# Patient Record
Sex: Male | Born: 1995 | Race: White | Hispanic: No | Marital: Single | Smoking: Never smoker
Health system: Southern US, Community
[De-identification: ages and names within clinical notes are randomized; demographics above are authoritative.]

## PROBLEM LIST (undated history)

## (undated) DIAGNOSIS — T7840XA Allergy, unspecified, initial encounter: Secondary | ICD-10-CM

## (undated) HISTORY — PX: WRIST FRACTURE SURGERY: SHX121

## (undated) HISTORY — DX: Allergy, unspecified, initial encounter: T78.40XA

## (undated) HISTORY — PX: OTHER SURGICAL HISTORY: SHX169

---
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
## 2017-05-02 ENCOUNTER — Ambulatory Visit: Payer: Managed Care, Other (non HMO) | Admitting: Family Medicine

## 2017-05-02 ENCOUNTER — Encounter: Payer: Self-pay | Admitting: Family Medicine

## 2017-05-02 ENCOUNTER — Ambulatory Visit (INDEPENDENT_AMBULATORY_CARE_PROVIDER_SITE_OTHER): Payer: Managed Care, Other (non HMO)

## 2017-05-02 ENCOUNTER — Ambulatory Visit: Payer: Self-pay

## 2017-05-02 VITALS — BP 113/68 | HR 76 | Temp 97.6°F | Ht 69.5 in | Wt 171.4 lb

## 2017-05-02 DIAGNOSIS — M79671 Pain in right foot: Secondary | ICD-10-CM

## 2017-05-02 DIAGNOSIS — S93491A Sprain of other ligament of right ankle, initial encounter: Secondary | ICD-10-CM | POA: Diagnosis not present

## 2017-05-02 MED ORDER — IBUPROFEN 600 MG PO TABS: 600.0000 mg | ORAL_TABLET | Freq: Three times a day (TID) | ORAL | 0 refills | Status: DC | PRN

## 2017-05-02 NOTE — Progress Notes (Signed)
Subjective: CC: Foot injury PCP: Bennie PieriniMartin, Mary-Margaret, FNP ZOX:WRUEAHPI:Rhyatt Redge GainerM Branford is a 22 y.o. male presenting to clinic today for:  1. Foot injury Patient reports that he was performing in a competition where he was doing chin ups and he vaulted off of the bar from several feet off the ground.  He notes that he landed in a twisting motion.  He shows me a video of this.  It does not appear that he landed on the calcaneus but rather than the midfoot with a slight twisting motion of the foot.  No eversion or inversion noted.  He notes that he had pain, particularly along the medial aspect of the heel.  This had associated swelling and bruising at that time.  Denies any numbness or tingling.  He reports discomfort with ambulation.  He has been using a compression sleeve but no other medications for symptoms.  He comes in to make sure he has not broken anything.   ROS: Per HPI  No Known Allergies Past Medical History:  Diagnosis Date  . Allergy    seasonal   No current outpatient medications on file. Social History   Socioeconomic History  . Marital status: Single    Spouse name: Not on file  . Number of children: Not on file  . Years of education: Not on file  . Highest education level: Not on file  Social Needs  . Financial resource strain: Not on file  . Food insecurity - worry: Not on file  . Food insecurity - inability: Not on file  . Transportation needs - medical: Not on file  . Transportation needs - non-medical: Not on file  Occupational History  . Not on file  Tobacco Use  . Smoking status: Never Smoker  . Smokeless tobacco: Never Used  Substance and Sexual Activity  . Alcohol use: No  . Drug use: No  . Sexual activity: No  Other Topics Concern  . Not on file  Social History Narrative   Student [facility] [employer].  Studying art.  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   Family History  Problem Relation Age of Onset  . Heart disease Maternal Grandmother   . Heart attack  Maternal Grandfather 70  . Hypertension Paternal Grandfather     Objective: Office vital signs reviewed. BP 113/68   Pulse 76   Temp 97.6 F (36.4 C) (Oral)   Ht 5' 9.5" (1.765 m)   Wt 171 lb 6 oz (77.7 kg)   BMI 24.94 kg/m   Physical Examination:  General: Awake, alert, well nourished, well appearing, No acute distress Extremities: warm, well perfused, No edema, cyanosis or clubbing; +2 pulses bilaterally MSK: normal gait and normal station  Right foot: Patient has full active range of motion.  5/5 strength of the ankle and foot in all planes.  There is a yellow bruise appreciated along the medial calcaneus and behind the medial malleolus.  Patient has no gross visible deformities otherwise.  No tenderness to palpation along the medial or lateral malleoli.  He has mild tenderness along the medial aspect of the calcaneus and over the area of the tibial calcaneal ligament.  No gross deformities or gapping noted with ligamentous testing.  No ligamentous laxity noted. Skin: dry; intact; no rashes or lesions Neuro: light touch sensation grossly in tact  Dg Foot Complete Right  Result Date: 05/02/2017 CLINICAL DATA:  22 year old male status post 6 ft jump landing on bare feet several days ago. Painful arch with bruising.  EXAM: RIGHT FOOT COMPLETE - 3+ VIEW COMPARISON:  None. FINDINGS: Bone mineralization is within normal limits. Skeletally mature. Normal joint spaces and alignment throughout the right foot. The calcaneus appears intact. Other tarsal bones appear intact. Metatarsals appear intact and normally aligned. No fracture or dislocation identified. IMPRESSION: No acute fracture or dislocation identified about the right foot. Electronically Signed   By: Odessa Fleming M.D.   On: 05/02/2017 12:26     Assessment/ Plan: 22 y.o. male   1. Right foot pain Given reports of instant swelling, bruising, x-ray was obtained to further evaluate.  Personally reviewed demonstrated no acute fractures or  dislocations.  Radiologist review confirms negative x-ray.  No ligamentous laxity on exam but I do suspect that he is got a significant sprain.  I did offer him a lace up ankle brace but patient preferred to continue with his own ankle sleeve.  Rice therapy recommended.  Ibuprofen 600 mg p.o. 3 times daily as needed pain or swelling.  Avoid high-impact activity and aggravating activities for the next several weeks.  Home care instructions reviewed.  Patient follow-up as needed. - DG Foot Complete Right; Future  2. Sprain of other ligament of right ankle, initial encounter As above.   Orders Placed This Encounter  Procedures  . DG Foot Complete Right    Standing Status:   Future    Standing Expiration Date:   07/03/2018    Order Specific Question:   Reason for Exam (SYMPTOM  OR DIAGNOSIS REQUIRED)    Answer:   foot pain    Order Specific Question:   Preferred imaging location?    Answer:   Internal    Order Specific Question:   Radiology Contrast Protocol - do NOT remove file path    Answer:   [email or web address]   Meds ordered this encounter  Medications  . ibuprofen (ADVIL,MOTRIN) 600 MG tablet    Sig: Take 1 tablet (600 mg total) by mouth every 8 (eight) hours as needed.    Dispense:  30 tablet    Refill:  0     Ashly Hulen Skains, DO [facility] [phone]

## 2017-05-02 NOTE — Patient Instructions (Signed)
You have been prescribed an NSAID today to help with pain and inflammation.  Please make sure that you take this medication with a meal and plenty of water.  Please do not take any other NSAID medications while on this medication.  NSAID medications include: ibuprofen (Advil, Motrin), naproxen (Aleve, Naprosyn), Celebrex, diclofenac (Voltaren), meloxicam (Mobic), etc.  If you are on a daily aspirin, please take this medication with extreme caution as the combination can increase your risk of bleeding and GI upset.  If you have a history of GI bleed or stomach ulcer, you should not take NSAID medications.  If you have a history of acid reflux, you should make sure to take antacid medication daily while on NSAID.   Ankle Sprain An ankle sprain is a stretch or tear in one of the tough tissues (ligaments) in your ankle. Follow these instructions at home:  Rest your ankle.  Take over-the-counter and prescription medicines only as told by your doctor.  For 2-3 days, keep your ankle higher than the level of your heart (elevated) as much as possible.  If directed, put ice on the area: ? Put ice in a plastic bag. ? Place a towel between your skin and the bag. ? Leave the ice on for 20 minutes, 2-3 times a day.  If you were given a brace: ? Wear it as told. ? Take it off to shower or bathe. ? Try not to move your ankle much, but wiggle your toes from time to time. This helps to prevent swelling.  If you were given an elastic bandage (dressing): ? Take it off when you shower or bathe. ? Try not to move your ankle much, but wiggle your toes from time to time. This helps to prevent swelling. ? Adjust the bandage to make it more comfortable if it feels too tight. ? Loosen the bandage if you lose feeling in your foot, your foot tingles, or your foot gets cold and blue.  If you have crutches, use them as told by your doctor. Continue to use them until you can walk without feeling pain in your  ankle. Contact a doctor if:  Your bruises or swelling are quickly getting worse.  Your pain does not get better after you take medicine. Get help right away if:  You cannot feel your toes or foot.  Your toes or your foot looks blue.  You have very bad pain that gets worse. This information is not intended to replace advice given to you by your health care provider. Make sure you discuss any questions you have with your health care provider. Document Released: 07/26/2007 Document Revised: 07/15/2015 Document Reviewed: 09/08/2014 Elsevier Interactive Patient Education  [employer]2018 [employer]

## 2017-08-29 ENCOUNTER — Ambulatory Visit: Payer: Managed Care, Other (non HMO) | Admitting: Pediatrics

## 2017-08-29 ENCOUNTER — Encounter: Payer: Self-pay | Admitting: Pediatrics

## 2017-08-29 VITALS — BP 121/69 | HR 64 | Temp 97.4°F | Ht 69.0 in | Wt 185.0 lb

## 2017-08-29 DIAGNOSIS — H6121 Impacted cerumen, right ear: Secondary | ICD-10-CM

## 2017-08-29 NOTE — Progress Notes (Signed)
  Subjective:   Patient ID: Jerry Cooley, male    DOB: 08-03-1995, 22 y.o.   MRN: [id number] CC: Right ear stopped up (since vacation)  HPI: Jerry Cooley is a 22 y.o. male   For the last week he has felt like his right ear is clogged.  No URI symptoms, no ear pain, no fevers.  Appetite is normal.  Feels like he cannot hear quite as well from his right ear compared to his left ear.  Relevant past medical, surgical, family and social history reviewed. Allergies and medications reviewed and updated. Social History   Tobacco Use  Smoking Status Never Smoker  Smokeless Tobacco Never Used   ROS: Per HPI   Objective:    BP 121/69   Pulse 64   Temp (!) 97.4 F (36.3 C) (Oral)   Ht 5\' 9"  (1.753 m)   Wt 185 lb (83.9 kg)   BMI 27.32 kg/m   Wt Readings from Last 3 Encounters:  08/29/17 185 lb (83.9 kg) (84 %, Z= 1.01)*  05/02/17 171 lb 6 oz (77.7 kg) (74 %, Z= 0.63)*  12/29/16 165 lb (74.8 kg) (68 %, Z= 0.46)*   * Growth percentiles are based on CDC (Boys, 2-20 Years) data.    Gen: NAD, alert, cooperative with exam, NCAT EYES: EOMI, no conjunctival injection, or no icterus ENT: Left TM normal pearly gray.  Right TM with impacted cerumen.  OP without erythema LYMPH: no cervical LAD CV: NRRR, normal S1/S2, no murmur, distal pulses 2+ b/l Resp: CTABL, no wheezes, normal WOB Ext: No edema, warm Neuro: Alert and oriented  Assessment & Plan:  Jerry Cooley was seen today for right ear stopped up.  Diagnoses and all orders for this visit:  Impacted cerumen of right ear Discussed options.  Impacted cerumen removal: After procedure described to patient and  patient agreed with proceeding, cerumen impaction removal was attempted from right ears using currette.  Right still with some cerumen after procedure with the decision made with patient to stop as he had had some improvement in symptoms and a fair amount of cerumen removed.  Pt tolerated procedure well.  He will use Debrox drops and  return if needed.   Follow up plan: As needed Rex Krasarol Marcello Tuzzolino, MD [facility]

## 2017-11-19 ENCOUNTER — Ambulatory Visit: Payer: Managed Care, Other (non HMO) | Admitting: Nurse Practitioner

## 2017-11-21 ENCOUNTER — Encounter: Payer: Self-pay | Admitting: Nurse Practitioner

## 2018-03-08 ENCOUNTER — Ambulatory Visit: Payer: Managed Care, Other (non HMO) | Admitting: Nurse Practitioner

## 2018-03-14 ENCOUNTER — Encounter: Payer: Self-pay | Admitting: Nurse Practitioner

## 2018-03-18 ENCOUNTER — Ambulatory Visit: Payer: Managed Care, Other (non HMO) | Admitting: Nurse Practitioner

## 2018-03-19 ENCOUNTER — Ambulatory Visit: Payer: Managed Care, Other (non HMO) | Admitting: Nurse Practitioner

## 2018-03-20 ENCOUNTER — Encounter: Payer: Self-pay | Admitting: Nurse Practitioner

## 2018-08-31 IMAGING — DX DG WRIST COMPLETE 3+V*R*
4 series · 4 of 4 positions shown · non-contrast
Comparison: None.

CLINICAL DATA: Right wrist pain.

EXAM:
RIGHT WRIST - COMPLETE 3+ VIEW

[wrist ap (1 of 2)]
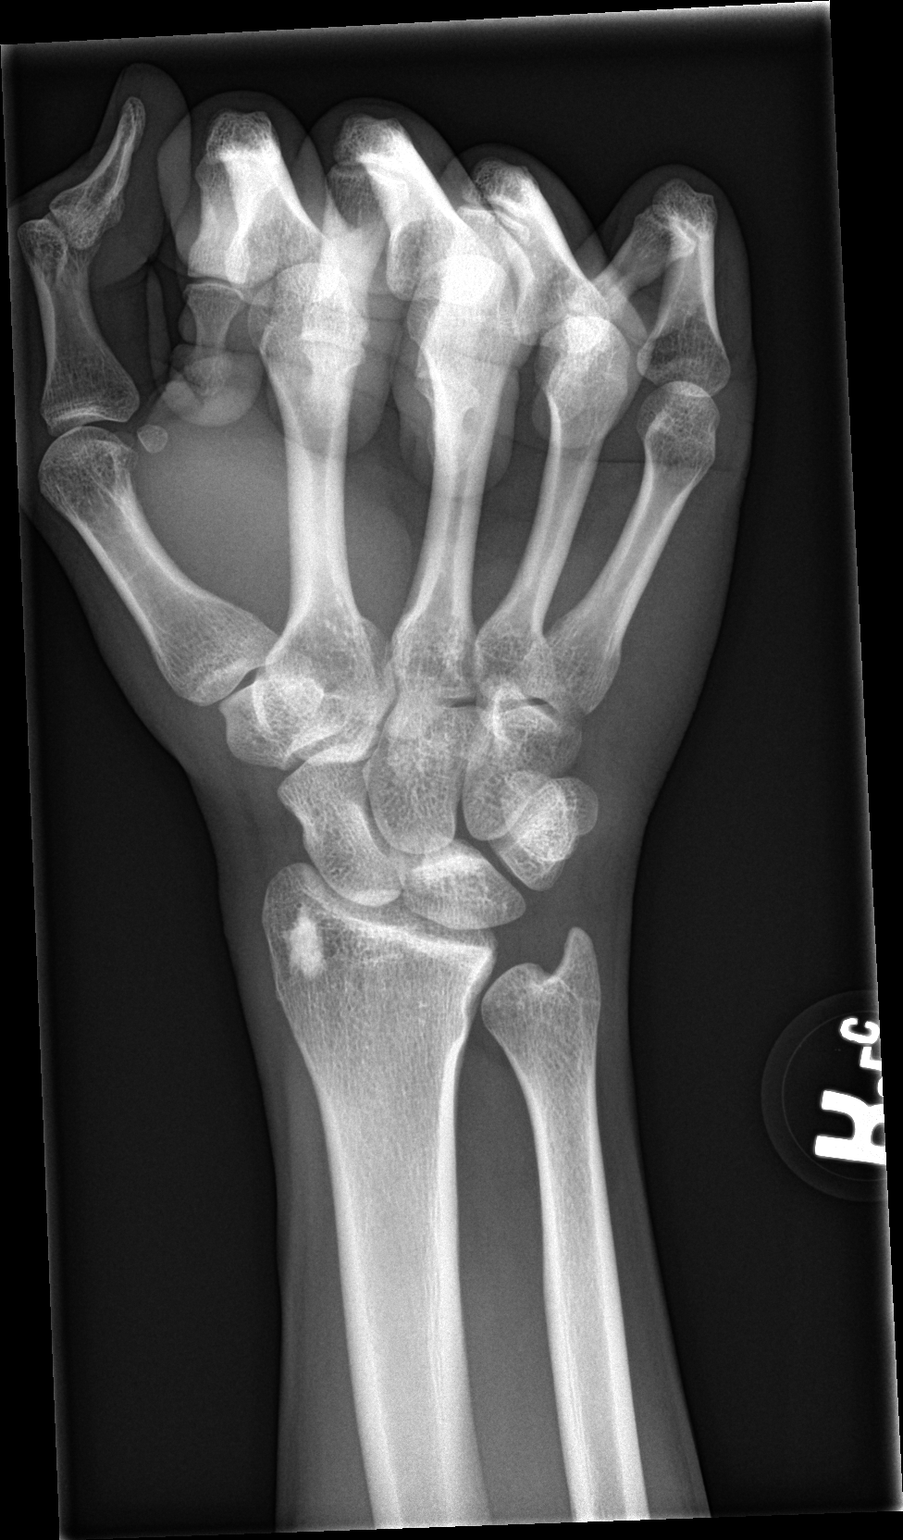

[wrist obl]
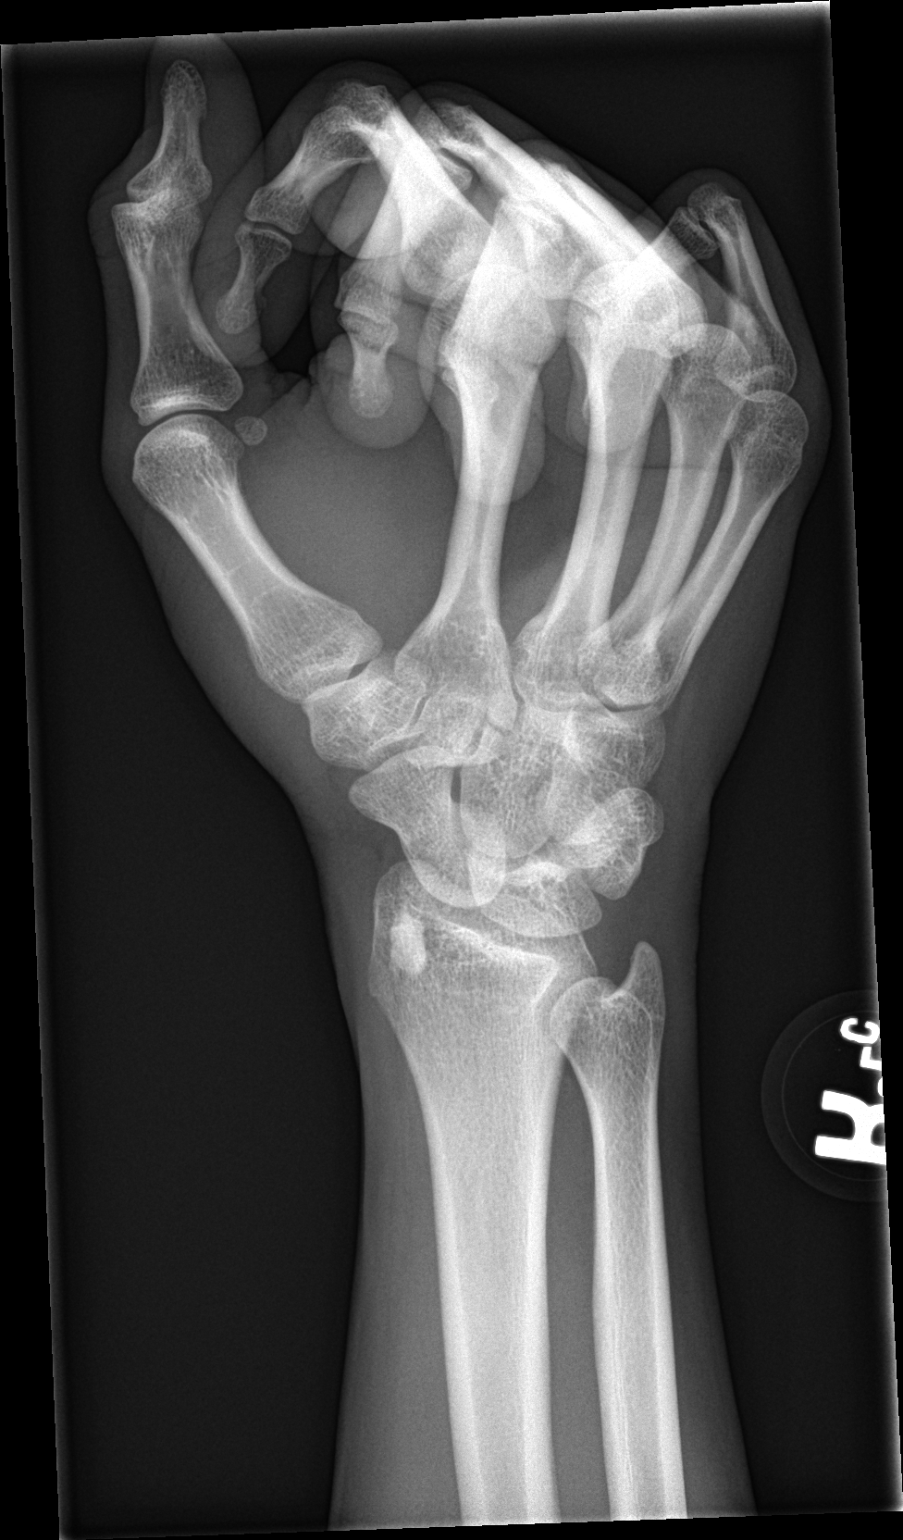

[wrist lat]
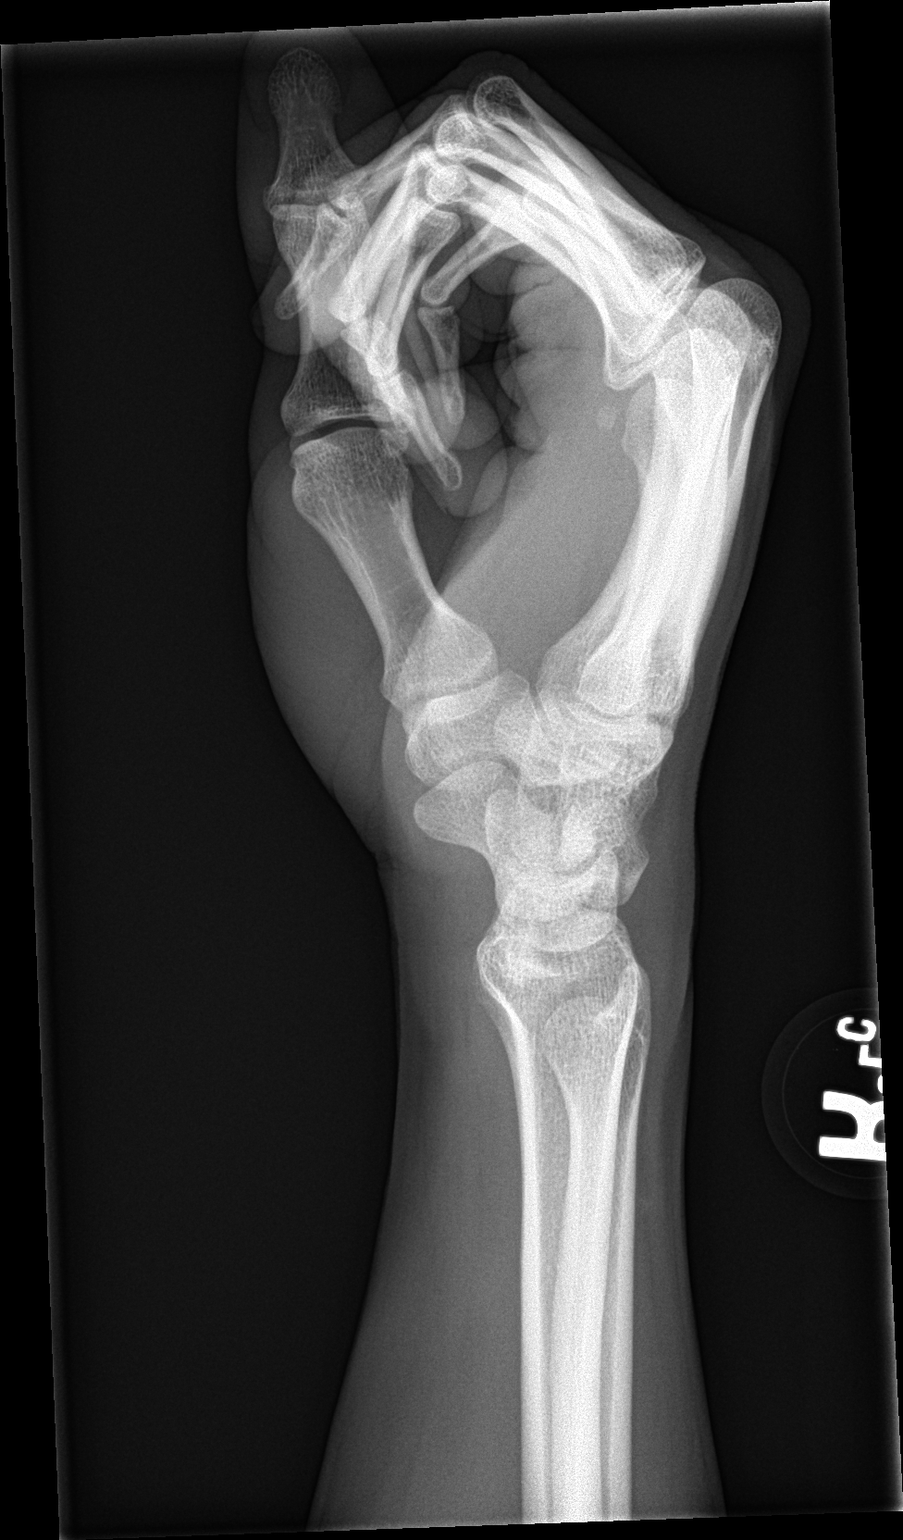

[wrist ap (2 of 2)]
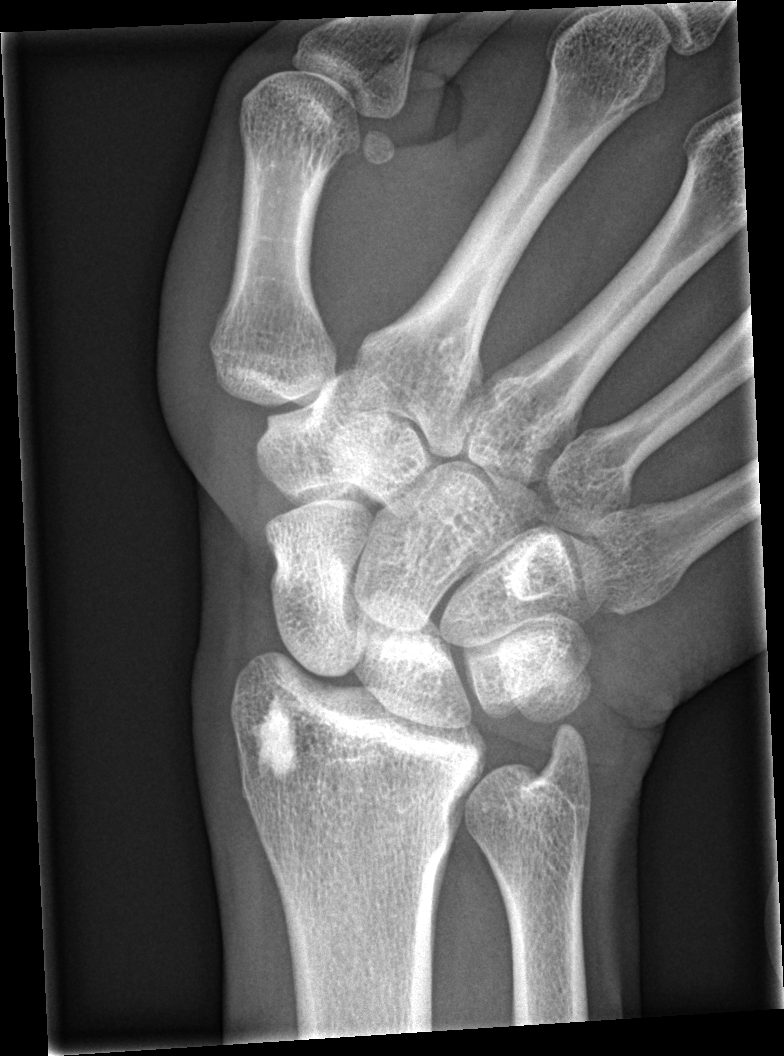

[4 of 4 positions shown; findings below may reference images not displayed]

FINDINGS: There is no evidence of fracture or dislocation. There is a bone
island in the distal radius. There is no evidence of arthropathy or
other focal bone abnormality. Soft tissues are unremarkable.
IMPRESSION: No acute osseous injury of the right wrist.

## 2019-01-02 IMAGING — DX DG FOOT COMPLETE 3+V*R*
3 series · 3 of 3 positions shown · non-contrast
Comparison: None.

CLINICAL DATA: 21-year-old male status post 6 ft jump landing on
bare feet several days ago. Painful arch with bruising.

EXAM:
RIGHT FOOT COMPLETE - 3+ VIEW

[foot ap]
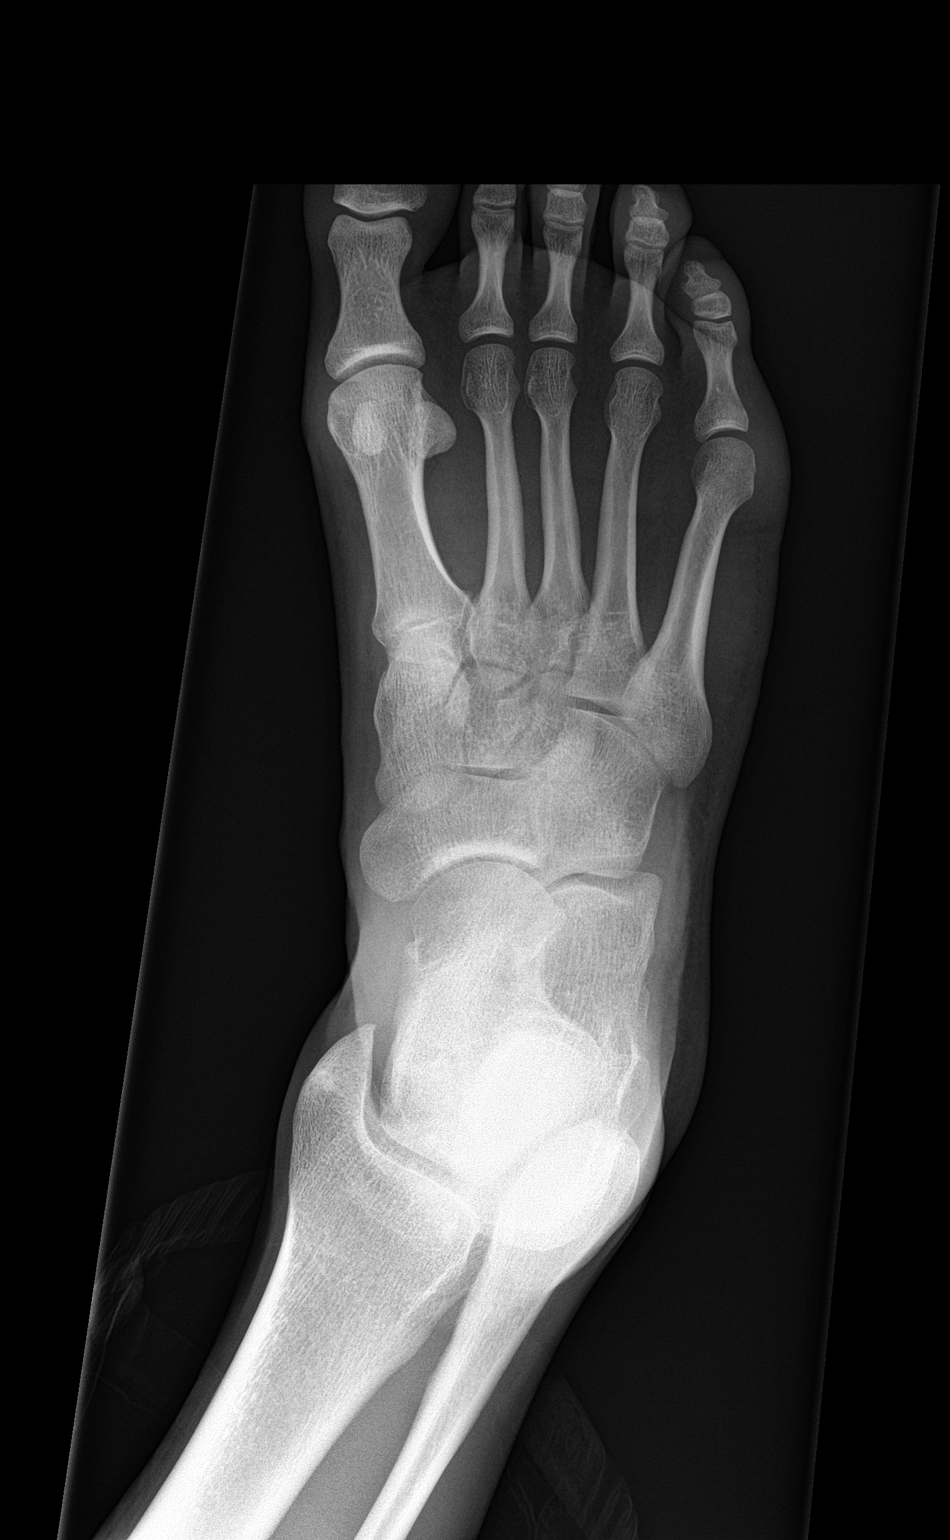

[foot obl]
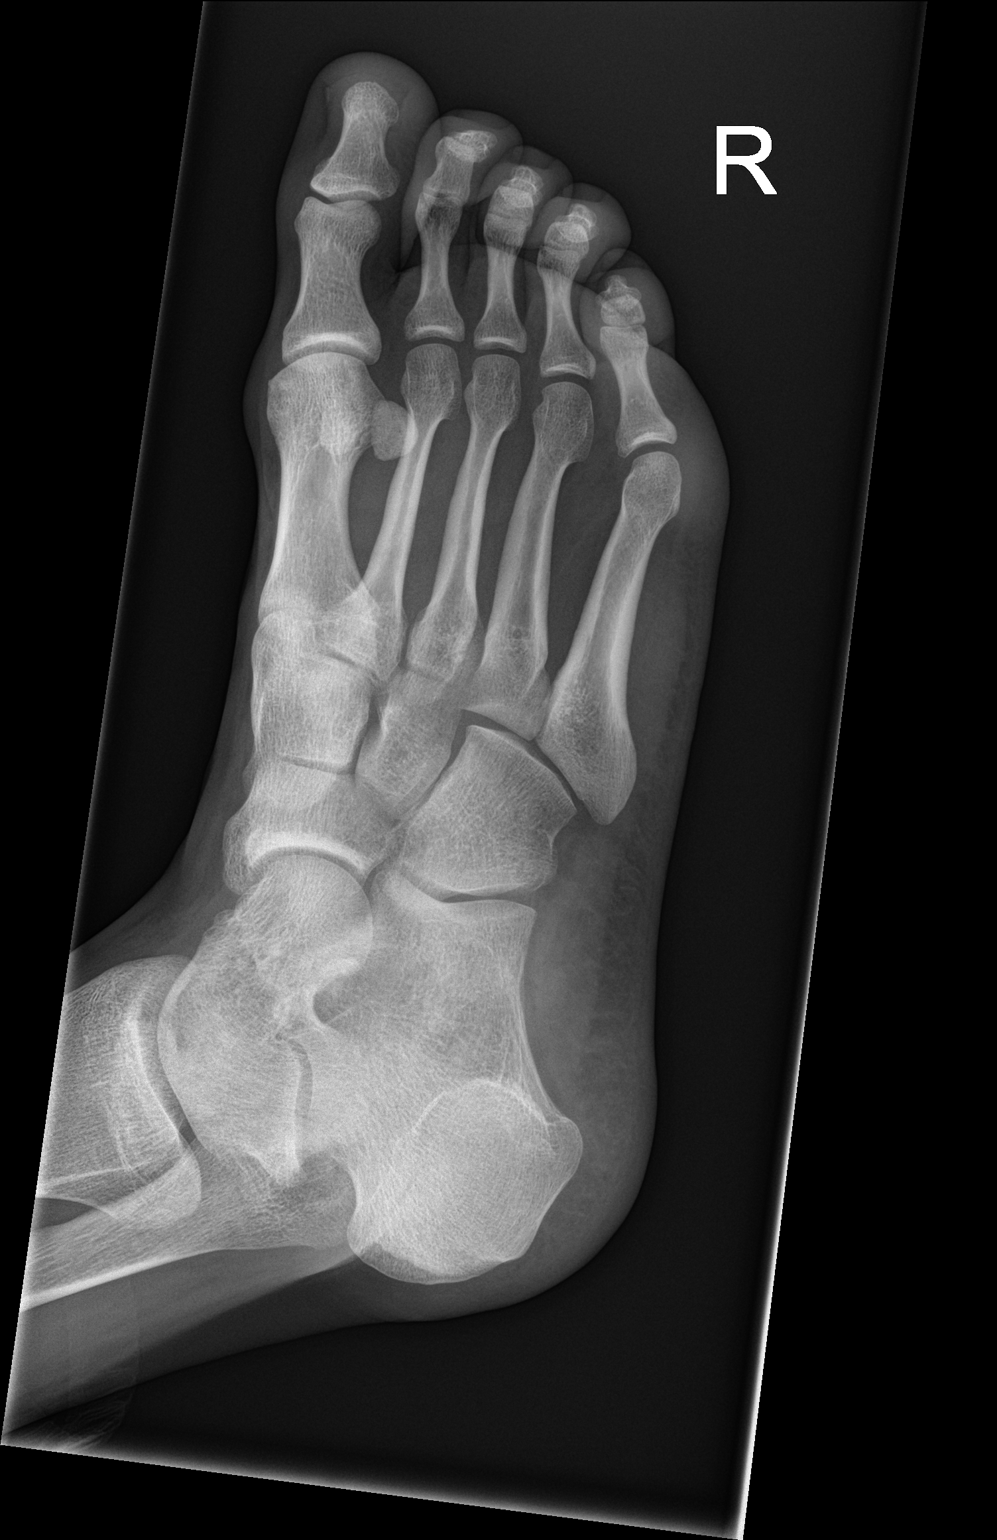

[foot lat]
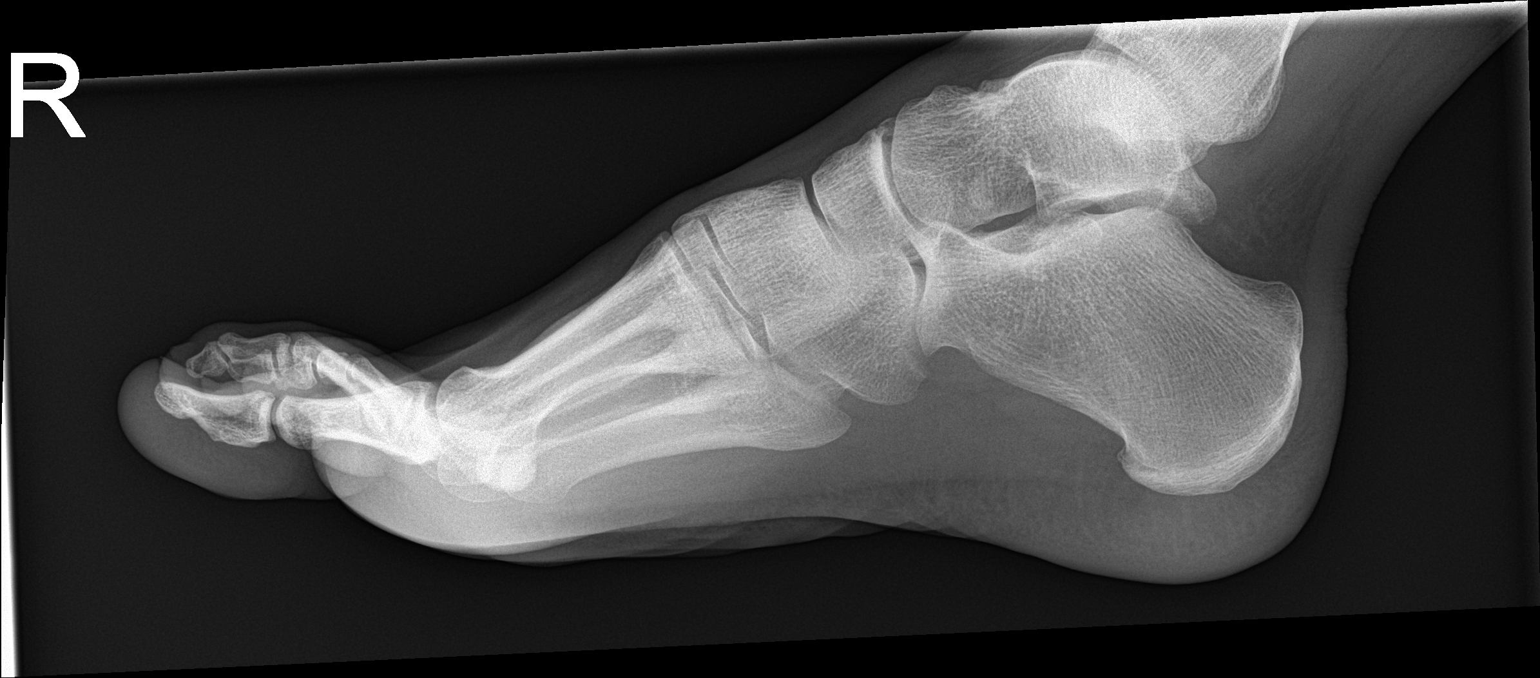

[3 of 3 positions shown; findings below may reference images not displayed]

FINDINGS: Bone mineralization is within normal limits. Skeletally mature.
Normal joint spaces and alignment throughout the right foot. The
calcaneus appears intact. Other tarsal bones appear intact.
Metatarsals appear intact and normally aligned. No fracture or
dislocation identified.
IMPRESSION: No acute fracture or dislocation identified about the right foot.

## 2024-01-02 DIAGNOSIS — Z419 Encounter for procedure for purposes other than remedying health state, unspecified: Secondary | ICD-10-CM | POA: Diagnosis not present
